# Patient Record
Sex: Female | Born: 1945 | Race: White | Hispanic: No | Marital: Married
Health system: Midwestern US, Academic
[De-identification: ages and names within clinical notes are randomized; demographics above are authoritative.]

---

## 2023-01-22 ENCOUNTER — Encounter: Admit: 2023-01-22 | Discharge: 2023-01-22 | Payer: MEDICARE

## 2023-01-23 ENCOUNTER — Encounter: Admit: 2023-01-23 | Discharge: 2023-01-23 | Payer: MEDICARE

## 2023-01-23 DIAGNOSIS — D333 Benign neoplasm of cranial nerves: Secondary | ICD-10-CM

## 2023-01-26 ENCOUNTER — Encounter: Admit: 2023-01-26 | Discharge: 2023-01-26 | Payer: MEDICARE

## 2023-02-03 ENCOUNTER — Encounter: Admit: 2023-02-03 | Discharge: 2023-02-03 | Payer: MEDICARE

## 2023-02-11 ENCOUNTER — Encounter: Admit: 2023-02-11 | Discharge: 2023-02-11 | Payer: MEDICARE

## 2023-02-11 ENCOUNTER — Ambulatory Visit: Admit: 2023-02-11 | Discharge: 2023-02-11 | Payer: MEDICARE

## 2023-02-11 DIAGNOSIS — Z9981 Dependence on supplemental oxygen: Secondary | ICD-10-CM

## 2023-02-11 DIAGNOSIS — J984 Other disorders of lung: Secondary | ICD-10-CM

## 2023-02-11 DIAGNOSIS — D333 Benign neoplasm of cranial nerves: Secondary | ICD-10-CM

## 2023-02-11 DIAGNOSIS — F419 Anxiety disorder, unspecified: Secondary | ICD-10-CM

## 2023-02-11 DIAGNOSIS — I519 Heart disease, unspecified: Secondary | ICD-10-CM

## 2023-02-11 DIAGNOSIS — D539 Nutritional anemia, unspecified: Secondary | ICD-10-CM

## 2023-02-11 DIAGNOSIS — N399 Disorder of urinary system, unspecified: Secondary | ICD-10-CM

## 2023-02-11 DIAGNOSIS — I10 Essential (primary) hypertension: Secondary | ICD-10-CM

## 2023-02-11 DIAGNOSIS — M1990 Unspecified osteoarthritis, unspecified site: Secondary | ICD-10-CM

## 2023-02-11 DIAGNOSIS — R42 Dizziness and giddiness: Secondary | ICD-10-CM

## 2023-02-11 NOTE — Progress Notes
Date of Service: 02/11/2023    Subjective:             Adrienne Malone is a 77 y.o. female.    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  She got hearing aids in the last year.  She eventually underwent an MRI in December which revealed a right vestibular schwannoma.  She also has some balance problems and has had a few falls.  She does have significant spine disease.     Review of Systems   Constitutional: Negative.    HENT:  Positive for hearing loss.    Eyes: Negative.    Respiratory: Negative.     Cardiovascular: Negative.    Gastrointestinal: Negative.    Endocrine: Negative.    Genitourinary: Negative.    Musculoskeletal: Negative.    Skin: Negative.    Allergic/Immunologic: Negative.    Neurological: Negative.    Hematological: Negative.    Psychiatric/Behavioral: Negative.           Objective:          ALPRAZolam (XANAX) 0.5 mg tablet Take one tablet by mouth twice daily as needed.    amLODIPine (NORVASC) 5 mg tablet Take one tablet by mouth daily.    atorvastatin (LIPITOR) 10 mg tablet Take one tablet by mouth at bedtime daily.    carvediloL (COREG) 6.25 mg tablet Take one tablet by mouth twice daily.    hydroCHLOROthiazide (HYDRODIURIL) 25 mg tablet Take one tablet by mouth daily.    hydrOXYzine HCL (ATARAX) 25 mg tablet Take one tablet by mouth three times daily as needed.    losartan (COZAAR) 100 mg tablet Take one tablet by mouth daily.    mirabegron (MYRBETRIQ) 25 mg ER tablet Take one tablet by mouth daily.    potassium chloride (KLOR-CON SPRINKLE) 10 mEq capsule     rOPINIRole (REQUIP) 1 mg tablet Take two tablets by mouth at bedtime daily.    tolterodine (DETROL) 2 mg tablet     zolpidem (AMBIEN) 10 mg tablet Take one tablet by mouth at bedtime daily.     Vitals:    02/11/23 0842   BP: (!) 151/76   Pulse: 72   PainSc: Zero   Weight: 56.7 kg (125 lb)   Height: 160 cm (5' 3)     Body mass index is 22.14 kg/m?Marland Kitchen     Physical Exam      Exam today reveals normal eardrums and cranial nerves, her audiogram performed on 12/12/2022 reveals left normal to moderate high-frequency hearing loss on the right moderate to severe hearing loss with 28% and 100% discrimination in the right and left ears, respectively.  I did review her MRI performed in December 2023 revealing a right enhancing long 18 mm lesion with cystic changes, there is minimal brainstem compression.  It is not bulbous in the cerebellopontine angle.       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  She got hearing aids in the last year.  She eventually underwent an MRI in December which revealed a right vestibular schwannoma.  She also has some balance problems and has had a few falls.  She does have significant spine disease.    Exam today reveals normal eardrums and cranial nerves, her audiogram performed on  12/12/2022 reveals left normal to moderate high-frequency hearing loss on the right moderate to severe hearing loss with 28% and 100% discrimination in the right and left ears, respectively.  I did review her MRI performed in December 2023 revealing a right enhancing long 18 mm lesion with cystic changes, there is minimal brainstem compression.  It is not bulbous in the cerebellopontine angle.    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  I did discuss options including observation versus radiation versus surgery; I do recommend physical therapy, vestibular rehabilitation therapy for her balance.  I recommend observation in her case.  Will get a another MRI in her in about 7 to 8 months.

## 2023-03-19 ENCOUNTER — Encounter: Admit: 2023-03-19 | Discharge: 2023-03-19 | Payer: MEDICARE

## 2023-03-30 ENCOUNTER — Encounter: Admit: 2023-03-30 | Discharge: 2023-03-30 | Payer: MEDICARE

## 2023-05-07 ENCOUNTER — Encounter: Admit: 2023-05-07 | Discharge: 2023-05-07 | Payer: MEDICARE

## 2023-07-09 ENCOUNTER — Encounter: Admit: 2023-07-09 | Discharge: 2023-07-09 | Payer: MEDICARE

## 2023-07-16 ENCOUNTER — Encounter: Admit: 2023-07-16 | Discharge: 2023-07-16 | Payer: MEDICARE

## 2023-07-22 ENCOUNTER — Encounter: Admit: 2023-07-22 | Discharge: 2023-07-22 | Payer: MEDICARE

## 2023-07-23 ENCOUNTER — Encounter: Admit: 2023-07-23 | Discharge: 2023-07-23 | Payer: MEDICARE

## 2023-08-17 ENCOUNTER — Encounter: Admit: 2023-08-17 | Discharge: 2023-08-17 | Payer: MEDICARE

## 2023-08-17 DIAGNOSIS — Z9981 Dependence on supplemental oxygen: Secondary | ICD-10-CM

## 2023-08-17 DIAGNOSIS — I519 Heart disease, unspecified: Secondary | ICD-10-CM

## 2023-08-17 DIAGNOSIS — F419 Anxiety disorder, unspecified: Secondary | ICD-10-CM

## 2023-08-17 DIAGNOSIS — N399 Disorder of urinary system, unspecified: Secondary | ICD-10-CM

## 2023-08-17 DIAGNOSIS — D539 Nutritional anemia, unspecified: Secondary | ICD-10-CM

## 2023-08-17 DIAGNOSIS — R42 Dizziness and giddiness: Secondary | ICD-10-CM

## 2023-08-17 DIAGNOSIS — M1990 Unspecified osteoarthritis, unspecified site: Secondary | ICD-10-CM

## 2023-08-17 DIAGNOSIS — J984 Other disorders of lung: Secondary | ICD-10-CM

## 2023-08-17 DIAGNOSIS — I10 Essential (primary) hypertension: Secondary | ICD-10-CM

## 2023-12-08 ENCOUNTER — Encounter: Admit: 2023-12-08 | Discharge: 2023-12-08 | Payer: MEDICARE

## 2023-12-08 NOTE — Telephone Encounter
Patient called to ask if mri  order can be sent to [facility] in [place], [place].

## 2023-12-08 NOTE — Telephone Encounter
MRI order faxed as requested to [facility], fax [phone]

## 2023-12-11 ENCOUNTER — Encounter: Admit: 2023-12-11 | Discharge: 2023-12-11 | Payer: MEDICARE

## 2023-12-11 NOTE — Telephone Encounter
Order updated and faxed as requested.

## 2023-12-11 NOTE — Telephone Encounter
Adrienne Malone w/scheduling @ [facility] called in stated pt stated she needs to be sedated for MRI Brain, she needs an updated order that says that. Please refax to [phone].

## 2024-01-04 IMAGING — CT CT ANGIO CHEST PE PROTOCOL
1 of 3 series · 16 of 32 positions shown · IV contrast (ISOVUE 370)
Comparison: none

Type of study: CTA of the chest.
CLINICAL HISTORY: Chest pain.

[Series 7: super d · axial · 0.60mm/px · z∈[-290,-19]mm · 16 of 392 slices shown]
[im 27/392  lung]
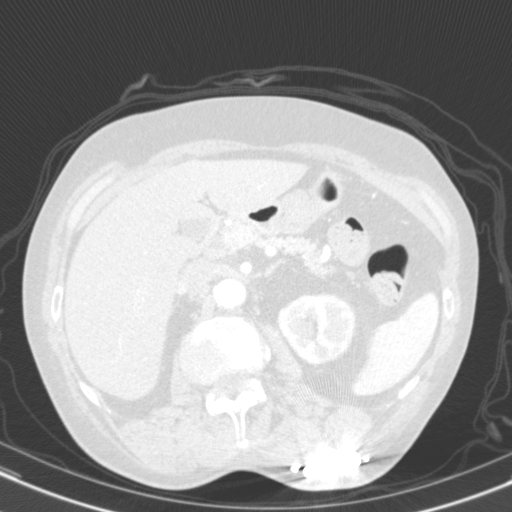
[im 53/392  mediastinal]
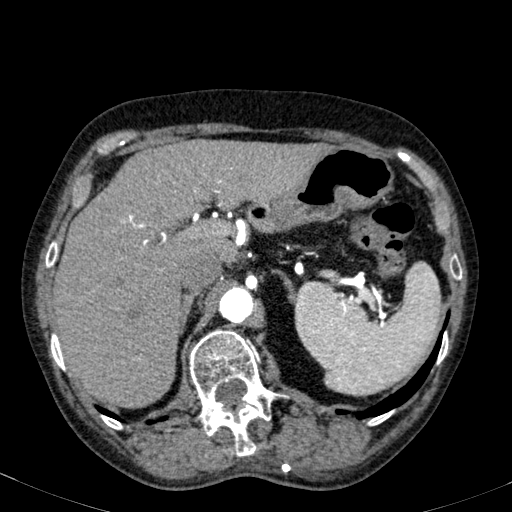
[im 79/392  lung]
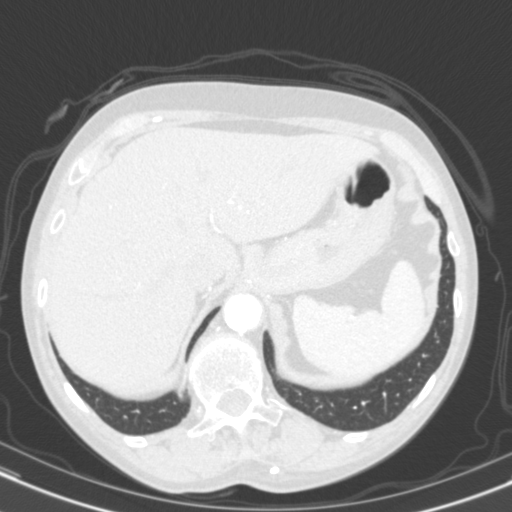
[im 105/392  mediastinal]
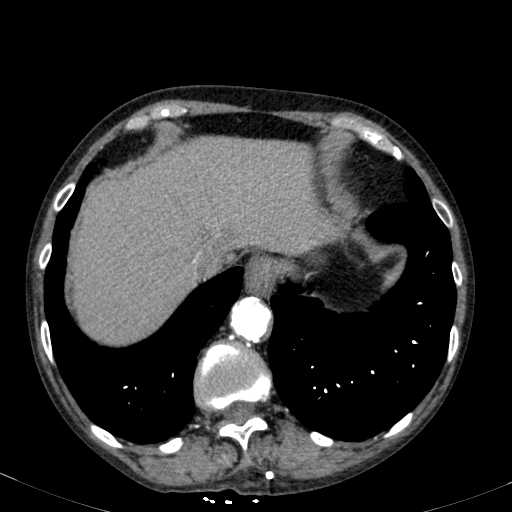
[im 131/392  lung]
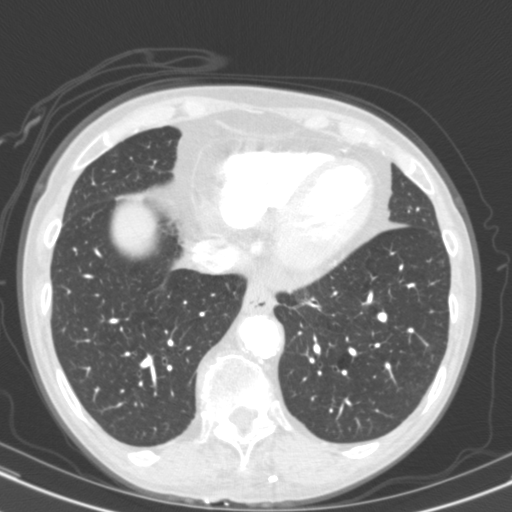
[im 157/392  mediastinal]
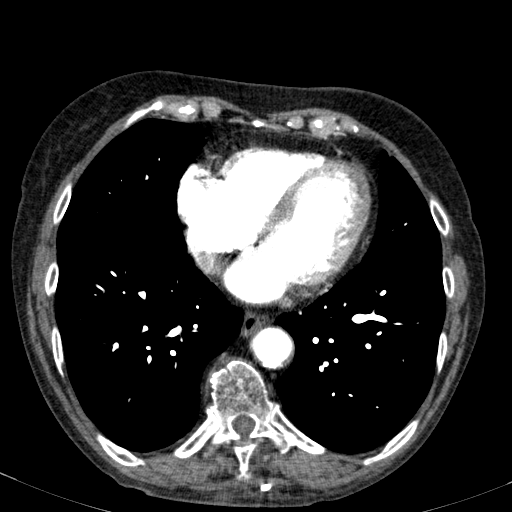
[im 183/392  lung]
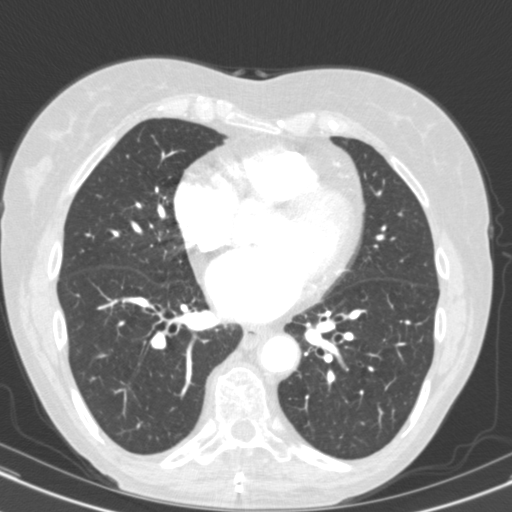
[im 187/392  mediastinal]
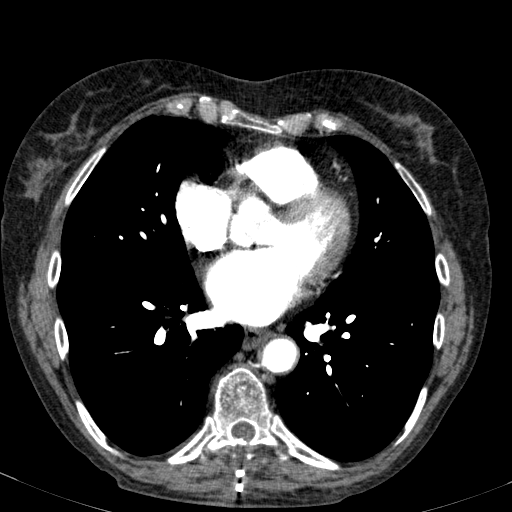
[im 196/392  lung]
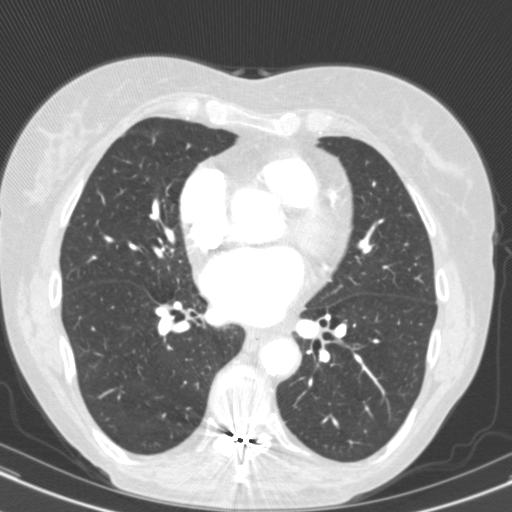
[im 209/392  mediastinal]
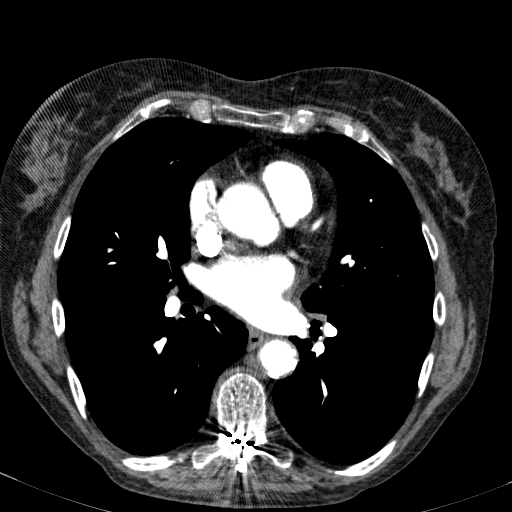
[im 235/392  lung]
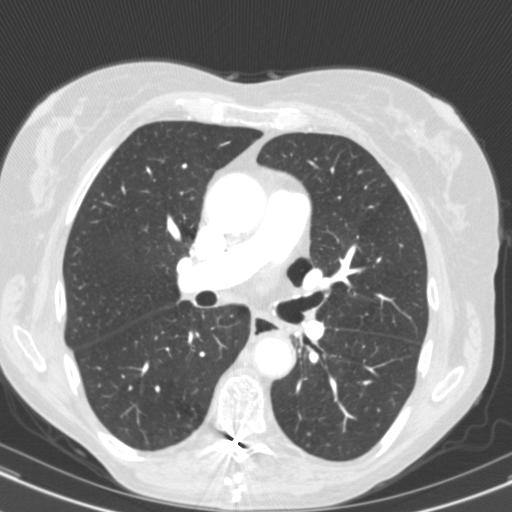
[im 261/392  mediastinal]
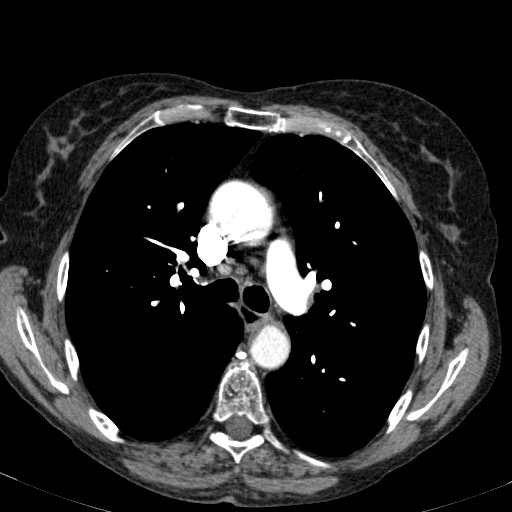
[im 287/392  lung]
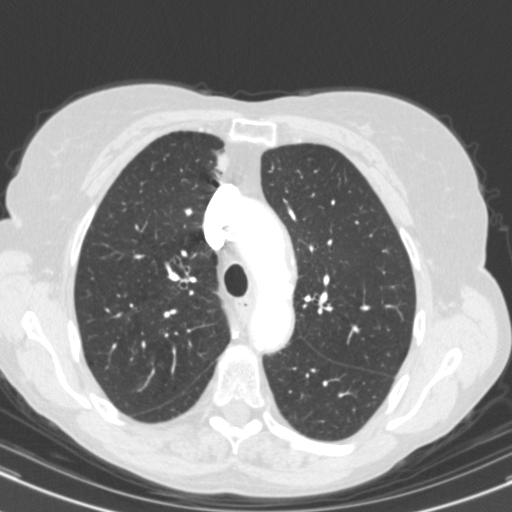
[im 313/392  mediastinal]
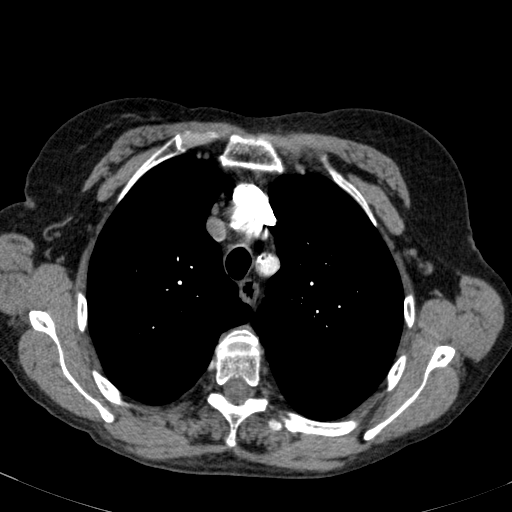
[im 339/392  lung]
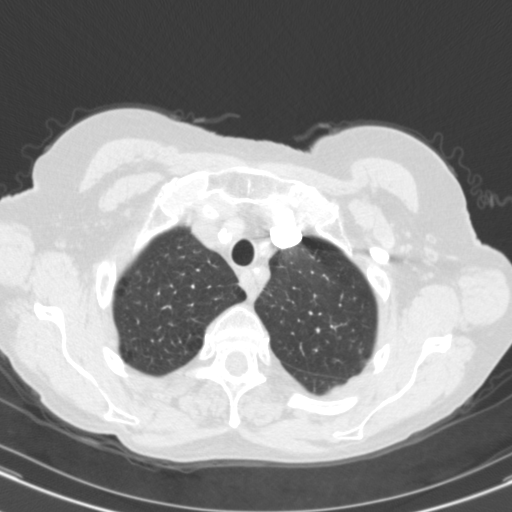
[im 365/392  mediastinal]
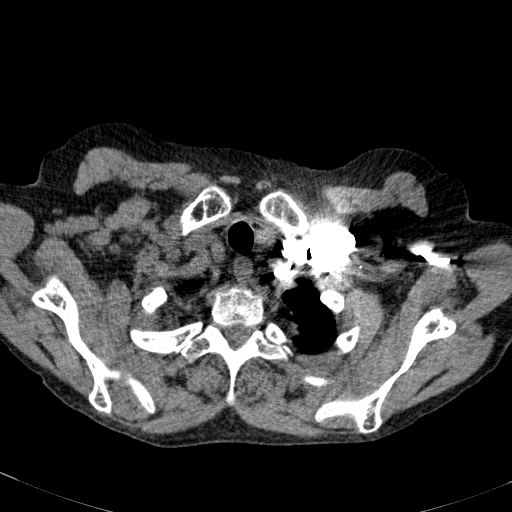

[16 of 32 positions shown; findings below may reference images not displayed]

IMPRESSION: 1. No CTA evidence of pulmonary embolus.
2. Vascular calcification with coronary artery disease.
3. Emphysema with central bronchi wall thickening. Right upper lobe groundglass opacity. Could represent scarring, atelectasis and/or pneumonitis.
4. Probable scarring and/or atelectasis right upper lobe medial aspect anterior segment.

## 2024-05-10 ENCOUNTER — Encounter: Admit: 2024-05-10 | Discharge: 2024-05-10 | Payer: MEDICARE

## 2024-06-06 ENCOUNTER — Encounter: Admit: 2024-06-06 | Discharge: 2024-06-06 | Payer: MEDICARE

## 2024-06-08 ENCOUNTER — Encounter: Admit: 2024-06-08 | Discharge: 2024-06-08 | Payer: MEDICARE

## 2024-06-09 ENCOUNTER — Encounter: Admit: 2024-06-09 | Discharge: 2024-06-09 | Payer: MEDICARE

## 2024-06-09 NOTE — Progress Notes
 [facility]   H&E Slides  Unstained Slides for - MSI, PDL1 and HER2 testing  Specimen collected 05/30/24   specimen - esophagus mass  [id number]  Requested 06/09/2024 fed ex: [id number]

## 2024-06-10 ENCOUNTER — Encounter: Admit: 2024-06-10 | Discharge: 2024-06-10 | Payer: MEDICARE

## 2024-06-12 ENCOUNTER — Encounter: Admit: 2024-06-12 | Discharge: 2024-06-12 | Payer: MEDICARE

## 2024-06-12 NOTE — Progress Notes
 Patient Name: Adrienne Malone  MRN: [id number]  DOB: 1946-11-23  Date of Service: 06/14/2024    Authored by Cornelia Dieter, MD    CLINIC NOTE     Adrienne Malone was seen and evaluated Dr. Eugene Hertz    Referring Provider: Self, Referral  No address on file     SOURCE OF INFORMATION: Available medical records and per the patient's report.    Family members present: Son, husband    REASON FOR VISIT: No chief complaint on file.       Cancer Staging   No matching staging information was found for the patient.      ONCOLOGIC HISTORY:   05/30/2024: EGD with dilation performed; esophageal mass identified (20-22 cm from incisors) concerning for cancer. Biopsies taken.    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    06/13/2024: CEA 15.1. CT CAP/PET scan performed at [facility] for staging. CT CHEST: 1. Large, near circumferential upper esophageal mass consistent with primary tumor. 2. Several mildly enlarged upper mediastinal lymph nodes, likely nodal metastases. 3. 11 mm irregular nodular opacity in the right upper lobe has an appearance most consistent with a primary pulmonary malignancy. Recommend PET/CT and/or biopsy. ABDOMEN AND PELVIS: 1. No metastatic disease identified in the abdomen or pelvis. PET/CT: Hypermetabolic upper esophageal mass compatible with primary neoplasm. Hypermetabolic left upper paratracheal/thoracic inlet nodularity, along the inferior aspect of the left lobe of the thyroid gland, most compatible with nodal metastatic disease. This may be amenable to ultrasound-guided FNA (as clinically indicated). Increased uptake involving two right upper lobe pulmonary nodular opacities which may be from primary neoplasms and/or metastatic disease (given persistence since the oldest available outside CT chest from December 2024). Relatively low-grade uptake involving mediastinal and right hilar lymph nodes which are indeterminate.    06/14/2024: Oncology consultation with Dr. Eugene Hertz regarding newly diagnosed esophageal cancer.     06/14/2024: Initial evaluation [facility].      INTERVAL HISTORY: 06/14/2024  The patient is a 78 year old with esophageal cancer who presents with difficulty swallowing and shortness of breath. She is accompanied by her son, Adrienne Malone.    She has been experiencing difficulty swallowing for the past three to four weeks, accompanied by soreness. She has had trouble eating for several years, noting prolonged time to consume solid food. Recently, she had an episode where she had to induce vomiting due to food impaction in the throat.    She experiences shortness of breath, particularly during ambulation, and reports feeling fatigued. No recent weight loss, diarrhea, blood in stool, or vomiting, except for the induced vomiting episode. She has a history of GERD surgery and suspects a hiatal hernia, though confirmation is uncertain.    She has a history of anemia managed with erythropoiesis-stimulating agents when hemoglobin drops below 11, a condition managed for two to three years. She also has coronary artery disease with two stents placed in the LAD, the most recent last year.    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 She has a history of smoking but quit in 1992. No diagnosis of COPD despite past smoking history.    Family history includes a son with throat cancer and a mother with colon cancer and lymphoma. She has undergone recent diagnostic studies, including an EGD endoscopy, CT scan, and PET CT.       PAST MEDICAL HISTORY:  Past Medical  History:    Acid reflux    Acoustic neuroma (CMS-HCC)    Anxiety disorder    Arthritis    Chronic lung disease    CKD (chronic kidney disease) stage 3, GFR 30-59 ml/min (CMS-HCC)    Dizziness    Heart disease    Hypertension    On supplemental oxygen therapy    Raynaud disease    Unspecified deficiency anemia    Urological disease PAST SURGICAL HISTORY:  Surgical History:   Procedure Laterality Date    CARDIAC SURGERY      ESOPHAGUS SURGERY      FACIAL COSMETIC SURGERY      SINUS SURGERY          ALLERGIES:  Allergies   Allergen Reactions    Adhesive Tape-Silicones HIVES, ITCHING and RASH     During her last surgery the adhesive tape used to keep IV in place and cover her surgical wound caused severe itching and rash   caused severe itching and rash   ONLY IF LEFT ON FOR LONG TIME    caused severe itching and rash    During her last surgery the adhesive tape used to keep IV in place and cover her surgical wound caused severe itching and rash    Evolocumab SHORTNESS OF BREATH     Other reaction(s): History Unknown    Penicillins HIVES    Bacitracin ITCHING     Patient denies this is an allergy    Sulfa (Sulfonamide Antibiotics) UNKNOWN     Other reaction(s): Unknown   Pt states they just don't work      Pt states they just don't work        MEDICATIONS:  See Epic for a full list of medications. All were reviewed in clinic today.     FAMILY HISTORY:  Family History   Problem Relation Name Age of Onset    Heart Attack Father Adrienne Malone     Migraines Daughter Adrienne Malone         SOCIAL HISTORY:  Social History     Socioeconomic History    Marital status: Married   Tobacco Use    Smoking status: Former     Current packs/day: 0.00     Average packs/day: 2.0 packs/day for 35.0 years (70.0 ttl pk-yrs)     Types: Cigarettes     Start date: 30     Quit date: 06/04/1995     Years since quitting: 29.0    Smokeless tobacco: Never   Substance and Sexual Activity    Alcohol use: Not Currently     Comment: Dont drink       Reviewed on: 06/14/2024    REVIEW OF SYMPTOMS   A 12 point review of systems was conducted Review of Systems   Constitutional:  Positive for activity change, appetite change, fatigue and unexpected weight change.   HENT:  Positive for trouble swallowing. Negative for congestion and dental problem.    Eyes:  Negative for discharge and itching.   Respiratory:  Positive for shortness of breath. Negative for apnea and chest tightness.    Cardiovascular:  Negative for chest pain and leg swelling.   Gastrointestinal:  Negative for abdominal distention and abdominal pain.   Endocrine: Negative for cold intolerance and heat intolerance.   Genitourinary:  Negative for difficulty urinating and dyspareunia.   Musculoskeletal:  Negative for arthralgias and back pain.   Skin:  Negative for color change and pallor.   Allergic/Immunologic: Negative for  environmental allergies and food allergies.   Neurological:  Negative for dizziness and facial asymmetry.   Psychiatric/Behavioral:  Negative for agitation and behavioral problems.        PHYSICAL EXAMINATION:  Vitals:    06/14/24 0921   BP: 139/64   BP Source: Arm, Left Upper   Pulse: 74   Temp: 36.6 ?C (97.8 ?F)   Resp: 18   SpO2: 93%   TempSrc: Oral   PainSc: Zero   Weight: 54.7 kg (120 lb 9.5 oz)   Height: 159 cm (5' 2.6)     Body mass index is 21.64 kg/m?Adrienne Malone          Pain Addressed:  N/A    Patient Evaluated for a Clinical Trial: No treatment clinical trial available for this patient.     [place] Cooperative Oncology Group performance status is 1, Restricted in physically strenuous activity but ambulatory and able to carry out work of a light or sedentary nature, e.g., light house work, office work.    Physical Exam  Vitals and nursing note reviewed.   Constitutional:       Appearance: Normal appearance.   HENT:      Head: Normocephalic and atraumatic.      Right Ear: Tympanic membrane normal.      Left Ear: Tympanic membrane normal.      Nose: Nose normal.      Mouth/Throat:      Mouth: Mucous membranes are moist.   Eyes:      Extraocular Movements: Extraocular movements intact.      Pupils: Pupils are equal, round, and reactive to light.   Cardiovascular:      Rate and Rhythm: Normal rate and regular rhythm.      Pulses: Normal pulses.      Heart sounds: Normal heart sounds.   Pulmonary: Effort: Pulmonary effort is normal.      Breath sounds: Normal breath sounds.   Abdominal:      General: Abdomen is flat. Bowel sounds are normal. There is no distension.      Palpations: Abdomen is soft. There is no mass.   Musculoskeletal:         General: No swelling or tenderness. Normal range of motion.      Cervical back: Normal range of motion and neck supple.   Skin:     General: Skin is warm.      Capillary Refill: Capillary refill takes less than 2 seconds.      Coloration: Skin is not jaundiced or pale.   Neurological:      General: No focal deficit present.      Mental Status: She is alert and oriented to person, place, and time. Mental status is at baseline.   Psychiatric:         Mood and Affect: Mood normal.         Behavior: Behavior normal.          LABORATORY DATA:  No results found for this or any previous visit (from the past 2 weeks).    IMAGING:   CT CHEST EXTERNAL IMAGING  This order has been auto finalized and does not contain a result.       PATHOLOGY:  No results found for: CPRPT    GENOMICS:    Liquid Biopsy:     Tumor Biopsy:     Genetic clinic referral:       ASSESSMENT AND PLAN:   Stage 4 esophageal cancer  Advanced esophageal cancer with  a primary tumor in the middle esophagus, confirmed by biopsy. Metastasis to mediastinal lymph nodes and possible lung involvement, with PET CT showing active lesions. Differential includes primary lung cancer versus metastasis. Potential tracheal compression raises concerns for fistula formation and aspiration pneumonia. Surgery is not viable due to advanced stage and metastasis risk.  - Refer to interventional radiology for lung lesion biopsy to differentiate primary lung cancer from metastasis.  - Refer for bronchoscopy to assess tracheal invasion.  - Initiate FOLFOX chemotherapy and pembrolizumab immunotherapy.  - Coordinate with Dr. Tun for local chemotherapy management and biopsy.  - Consider radiation therapy if tumor reduction occurs.  - Monitor treatment response with CT bi-monthly.  - I reached out to Dr. Tomasa Malone team and spoke to his nurse (he is away until later this week). We communicated the plan and will update on my notes.   - RTC PRN    Anemia in neoplastic disease  Chronic anemia, potentially exacerbated by esophageal cancer. Managed with ESA injections when hemoglobin <11. Possible causes include nutritional deficiencies and tumor-related bleeding.  - Check iron levels for supplementation needs.  - Coordinate with Dr. Tun for anemia management.    COPD  COPD managed with albuterol inhaler for dyspnea.  - Continue albuterol inhaler as needed.    Coronary artery disease with stents  Coronary artery disease with LAD stents placed, most recently in 2023.    History of GERD surgery  GERD surgery with previous hiatal hernia repair.    Follow-up  - Coordinate with Dr. Tun for treatment and follow-up care.  - Ensure completion of lung biopsy and bronchoscopy.  - Monitor treatment response with imaging every two months.  - Maintain communication with Dr. Tun for treatment adjustments.    The patient had appropriate questions, which were addressed to their satisfaction. After today's discussion, and per the patient's wishes, we have agreed upon the following plan.       Continue to be followed by local primary care provider and for other comorbid condition, as well as routine healthcare maintenance.     Prior to next appointment, instructed to contact us  with any additional questions or concerns.    Adrienne Magoon, MD, PhD

## 2024-06-13 ENCOUNTER — Ambulatory Visit: Admit: 2024-06-13 | Discharge: 2024-06-13 | Payer: MEDICARE

## 2024-06-13 ENCOUNTER — Encounter: Admit: 2024-06-13 | Discharge: 2024-06-13 | Payer: MEDICARE

## 2024-06-14 ENCOUNTER — Encounter: Admit: 2024-06-14 | Discharge: 2024-06-14 | Payer: MEDICARE

## 2024-06-14 DIAGNOSIS — J449 Chronic obstructive pulmonary disease, unspecified: Secondary | ICD-10-CM

## 2024-06-14 DIAGNOSIS — I10 Essential (primary) hypertension: Secondary | ICD-10-CM

## 2024-06-14 DIAGNOSIS — N183 Anemia of chronic kidney failure, stage 3 (moderate) (CMS-HCC): Secondary | ICD-10-CM

## 2024-06-14 DIAGNOSIS — C159 Malignant neoplasm of esophagus, unspecified: Secondary | ICD-10-CM

## 2024-06-14 NOTE — Patient Instructions
 Nurse: Leretha Rankin BSN, RN & Diamantina Form BSN, RN  Phone: [phone]  Fax: [phone]  Available Monday - Friday 8:00 am - 4:00 pm    Nurse: Phone: [phone]  Fax: [phone], available Monday-Friday 8:00am- 4:00pm. After your new patient visit, we will be listening into the clinic appointments via Teams on the iPad or communicating directly with our providers regarding your care. This is the best way to make sure we hear your concerns, the provider's recommendations and do the behind the scenes work to give you the best care.      SCHEDULING NEEDS: [phone]    Medication refills: Please contact your pharmacy for medication refills, if they do not have any refills on file they will contact our office, PLEASE make sure they have our correct contact information on file Phone: [phone], Fax: [phone]. Please allow 24 to 48 hours for medication refill requests. Please also reach out at least 1 WEEK prior to running out of medication, that way we can address your needs quickly and efficiently!     MyChart Messages: All MyChart messages are answered by nurses, even if you select to send the message to Dr. Eugene Hertz, Avanell Bob or [place].  We often communicate with our providers on how to answer these messages, but all messages will come directly from the nurses.  Messages are answered 8:00am-4:00pm Monday-Friday, holidays excluded. Always select Dr. Eugene Hertz, Annmarie Kindred, APRN-NP or Creola Doheny APRN-NP when sending a message as that will route your message to our team the fastest.    Phone Calls: Our phone number is strictly a nurse voicemail, we are hardly ever sitting at our desks where our phone is located as we are in clinic most days during the week. Please leave a message as we check our messages several times per day.  It is our goal to answer these messages as soon as possible, but please keep in mind we are actively seeing patients in clinic, which can result in a longer response time.  Messages are answered from 8:00am-4:00 pm Monday-Friday, holidays excluded.  Please make sure you leave your full name with the spelling, date of birth, reason for your call, and return number when leaving a message. Please do not leave multiple messages, this congests our voicemail and will result in longer response time.     FMLA/ Insurance/ Disability Paperwork: Please fill out as much of the paperwork as you are able. We request that you give us  7-10 business days to complete this as we have a high volume of paperwork to complete for our patients.     Medical Records:  In regards to the transfer of medical records from facility to facility, please contact the Medical Records department at [facility] to have this completed.  Due to patient care needs and coordinating clinic, the nurses are not able to fax clinic notes, labs and imaging results to different providers, but Medical Records would be glad to do that for you.  Please contact Medical Records at [phone], their fax number is [phone].      **Call immediately during NORMAL BUSINESS HOURS to report any of the following, by calling the cancer center operator at [phone] and asking to have Dr. Shaune Delaine nurse paged:**  Temperature of 100.5 or greater  Any signs/symptoms of infections: redness, swelling, warmth or tenderness  Shortness of breath that is new  Swelling of arms or legs  Uncontrolled pain or nausea/vomiting.     AFTER HOURS & ON CALL: [phone], on  call number for - URGENT NEEDS - outside of normal business hours (nights, weekends, holidays) ask for the On Call Oncology Fellow to be paged, and they will call you back. They will assist you further based on your signs and symptoms and provide recommendations.     If you are having difficulty breathing, chest pain, change in mental status or have lost consciousness, fallen and sustained an injury, severe pain, proceed to your closest emergency department, PLEASE DO NOT CALL OUR ON CALL NUMBER! If your family is not able to take you, call 911 for an ambulance, do not wait for our instructions.            Thank you for choosing the [facility] for your Oncology needs!      Dr. Eugene Hertz and Team

## 2024-06-16 ENCOUNTER — Encounter: Admit: 2024-06-16 | Discharge: 2024-06-16 | Payer: MEDICARE

## 2024-06-17 ENCOUNTER — Encounter: Admit: 2024-06-17 | Discharge: 2024-06-17 | Payer: MEDICARE

## 2024-06-17 ENCOUNTER — Ambulatory Visit: Admit: 2024-06-17 | Discharge: 2024-06-18 | Payer: MEDICARE

## 2024-06-20 ENCOUNTER — Encounter: Admit: 2024-06-20 | Discharge: 2024-06-20 | Payer: MEDICARE

## 2024-06-21 ENCOUNTER — Encounter: Admit: 2024-06-21 | Discharge: 2024-06-21 | Payer: MEDICARE

## 2024-07-05 ENCOUNTER — Encounter: Admit: 2024-07-05 | Discharge: 2024-07-05 | Payer: MEDICARE

## 2024-08-02 ENCOUNTER — Encounter: Admit: 2024-08-02 | Discharge: 2024-08-02 | Payer: MEDICARE

## 2024-09-18 ENCOUNTER — Encounter: Admit: 2024-09-18 | Discharge: 2024-09-18 | Payer: MEDICARE
